# Patient Record
Sex: Male | Born: 1955 | Race: White | Hispanic: No | Marital: Married | Smoking: Never smoker
Health system: Southern US, Community
[De-identification: ages and names within clinical notes are randomized; demographics above are authoritative.]

## PROBLEM LIST (undated history)

## (undated) DIAGNOSIS — M1990 Unspecified osteoarthritis, unspecified site: Secondary | ICD-10-CM

## (undated) DIAGNOSIS — E785 Hyperlipidemia, unspecified: Secondary | ICD-10-CM

## (undated) DIAGNOSIS — T7840XA Allergy, unspecified, initial encounter: Secondary | ICD-10-CM

## (undated) HISTORY — PX: CERVICAL DISC SURGERY: SHX588

## (undated) HISTORY — DX: Hyperlipidemia, unspecified: E78.5

## (undated) HISTORY — PX: LUMBAR DISC SURGERY: SHX700

## (undated) HISTORY — DX: Unspecified osteoarthritis, unspecified site: M19.90

## (undated) HISTORY — DX: Allergy, unspecified, initial encounter: T78.40XA

## (undated) HISTORY — PX: SHOULDER SURGERY: SHX246

## (undated) HISTORY — PX: TONSILLECTOMY: SUR1361

## (undated) HISTORY — PX: COLONOSCOPY: SHX174

## (undated) HISTORY — PX: WISDOM TOOTH EXTRACTION: SHX21

---

## 2002-12-23 ENCOUNTER — Observation Stay (HOSPITAL_COMMUNITY): Admission: AD | Admit: 2002-12-23 | Discharge: 2002-12-24 | Payer: Self-pay | Admitting: Internal Medicine

## 2006-12-25 ENCOUNTER — Encounter: Admission: RE | Admit: 2006-12-25 | Discharge: 2006-12-25 | Payer: Self-pay | Admitting: Internal Medicine

## 2007-03-23 ENCOUNTER — Ambulatory Visit (HOSPITAL_COMMUNITY): Admission: RE | Admit: 2007-03-23 | Discharge: 2007-03-23 | Payer: Self-pay | Admitting: Neurosurgery

## 2007-07-14 ENCOUNTER — Encounter: Admission: RE | Admit: 2007-07-14 | Discharge: 2007-07-14 | Payer: Self-pay | Admitting: Neurosurgery

## 2008-07-08 ENCOUNTER — Ambulatory Visit: Payer: Self-pay | Admitting: Gastroenterology

## 2010-07-31 NOTE — Op Note (Signed)
Oscar Wilson, Oscar Wilson               ACCOUNT NO.:  [id number]   MEDICAL RECORD NO.:  [id number]          PATIENT TYPE:  AMB   LOCATION:  SDS                          FACILITY:  [facility]   PHYSICIAN:  Henry A. Pool, M.D.    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   PROCEDURE NOTE:  Left C6-7 laminotomy, foraminotomy and microdiskectomy.   SURGEON:  Kathaleen Maser. Pool, M.D.   ASSISTANT:  Donalee Citrin, M.D.   ANESTHESIA:  General endotracheal anesthesia.   PREMEDICATION:  Oscar Wilson' is a 55 year old male with history of neck  pain with left upper extremity pain, paresthesias and weakness  consistent with a left-sided C7 radiculopathy.  Workup demonstrates  evidence of focal left-sided C6-7 disk herniation with marked  compression of left-sided C7 nerve root.  We discussed options of  management including operative and nonoperative treatment.  We discussed  in detail the possibility of undergoing a left-sided C6-7 laminotomy,  foraminotomy and microdiskectomy in hopes of improving his symptoms.  The patient aware of the risks and benefits and wishes to proceed with  surgery.   DESCRIPTION OF PROCEDURE:  The patient taken to the operating room,  placed on the table in supine position.  After adequate level of  anesthesia was achieved, the patient positioned prone onto bolsters with  his head fixed in a neutral head position using a [place] pin headrest.  The patient's posterior cervical region is prepped and draped sterilely.  A 10 blade was used to make a linear skin incision overlying the C6-7  interspace.  This was carried down sharply in the midline.  A  subperiosteal dissection was then performed exposing the lamina of facet  joints at C6 and C7 on the left side.  Deep self-retaining retractor was  placed.  Intraoperative fluoroscopy was used.  The C6-7 level was  confirmed.  Laminotomy was then performed using high-speed drill and  Kerrison rongeurs __________  the inferior aspect lamina of C6, medial  aspect of the C6-7 facet joint and the superior aspect of the C7 lamina.  Ligamentum flavum was then elevated and resected in piecemeal fashion  using Kerrison rongeurs.  Microscope was then brought into the field and  used for microdissection of the left-sided T7 nerve root underlying disk  herniation.  Working in the axilla of the C7 nerve root, The C7 nerve  root was gently mobilized and retracted cephalad.  The disk herniation  was readily apparent.  This was then incised with an 11 blade.  Disk  herniation was then expressed under pressure and removed using pituitary  micro rongeurs.  At this point, all elements of the disk herniation were  completely resected.  All free elements of disk herniation were  completely removed as well.  At this point, there is no evidence of any  continuing compression upon the thecal sac or nerve  roots.  There is no  injury to the thecal sac or nerve roots.  Wound is then irrigated with  antibiotic solution.  Gelfoam was placed topically for hemostasis, which  was found to be good.  Microscope and retractor system were removed.  Hemostasis in the muscle achieved with  electrocautery.  Wound was closed in layers with Vicryl suture.  Steri-  Strips and sterile dressing were applied.  There were no apparent  complications.  The patient tolerated the procedure well and he returns  to the recovery room postoperatively.           ______________________________  Kathaleen Maser Pool, M.D.     HAP/MEDQ  D:  03/23/2007  T:  03/23/2007  Job:  [id number]

## 2010-12-21 LAB — DIFFERENTIAL
Basophils Absolute: 0
Basophils Relative: 0
Eosinophils Absolute: 0.2
Eosinophils Relative: 4
Lymphocytes Relative: 33
Lymphs Abs: 1.8
Monocytes Absolute: 0.4
Monocytes Relative: 8
Neutro Abs: 3
Neutrophils Relative %: 55

## 2010-12-21 LAB — BASIC METABOLIC PANEL WITH GFR
BUN: 14
CO2: 28
Calcium: 9.7
Chloride: 104
Creatinine, Ser: 0.93
GFR calc non Af Amer: >60
Glucose, Bld: 102 — ABNORMAL HIGH
Potassium: 4.2
Sodium: 138

## 2010-12-21 LAB — TYPE AND SCREEN
ABO/RH(D): O POS
Antibody Screen: NEGATIVE

## 2010-12-21 LAB — CBC
Hemoglobin: 16
MCHC: 34.8
MCV: 89.9

## 2010-12-21 LAB — ABO/RH: ABO/RH(D): O POS

## 2010-12-21 LAB — PROTIME-INR
INR: 0.9
Prothrombin Time: 12.6

## 2013-07-30 ENCOUNTER — Other Ambulatory Visit: Payer: Self-pay | Admitting: Orthopedic Surgery

## 2013-07-30 DIAGNOSIS — G8929 Other chronic pain: Secondary | ICD-10-CM

## 2013-07-30 DIAGNOSIS — M549 Dorsalgia, unspecified: Principal | ICD-10-CM

## 2013-08-13 ENCOUNTER — Ambulatory Visit
Admission: RE | Admit: 2013-08-13 | Discharge: 2013-08-13 | Disposition: A | Discharge status: Home / Self Care | Payer: Managed Care, Other (non HMO) | Source: Ambulatory Visit | Attending: Orthopedic Surgery | Admitting: Orthopedic Surgery

## 2013-08-13 ENCOUNTER — Other Ambulatory Visit: Payer: Self-pay | Admitting: Orthopedic Surgery

## 2013-08-13 ENCOUNTER — Ambulatory Visit
Admission: RE | Admit: 2013-08-13 | Discharge: 2013-08-13 | Disposition: A | Discharge status: Home / Self Care | Payer: Self-pay | Source: Ambulatory Visit | Attending: Orthopedic Surgery | Admitting: Orthopedic Surgery

## 2013-08-13 VITALS — BP 113/72 | HR 42

## 2013-08-13 DIAGNOSIS — G8929 Other chronic pain: Secondary | ICD-10-CM

## 2013-08-13 DIAGNOSIS — M4807 Spinal stenosis, lumbosacral region: Secondary | ICD-10-CM

## 2013-08-13 DIAGNOSIS — M549 Dorsalgia, unspecified: Secondary | ICD-10-CM

## 2013-08-13 MED ORDER — IOHEXOL 180 MG/ML  SOLN
15.0000 mL | Freq: Once | INTRAMUSCULAR | Status: AC | PRN
  Administered 2013-08-13: 15 mL via INTRATHECAL

## 2013-08-13 MED ORDER — DIAZEPAM 5 MG PO TABS
10.0000 mg | ORAL_TABLET | Freq: Once | ORAL | Status: AC
  Administered 2013-08-13: 10 mg via ORAL

## 2013-08-13 NOTE — Discharge Instructions (Signed)

## 2015-08-12 IMAGING — RF DG MYELOGRAPHY LUMBAR INJ LUMBOSACRAL
6 of 17 series · 6 of 17 positions shown · non-contrast
Comparison: MRI lumbar spine 01/14/2011

CLINICAL DATA: Low back pain extending into the right hip and lower
extremity. The pain exacerbated with walking and standing. There is
some relief with flexion.
TECHNIQUE: Contiguous axial images were obtained through the Lumbar spine after
the intrathecal infusion of infusion. Coronal and sagittal
reconstructions were obtained of the axial image sets.

[Series 1: (hospital) · 1 of 1 slices shown (1 of 2)]
[im 1/1]
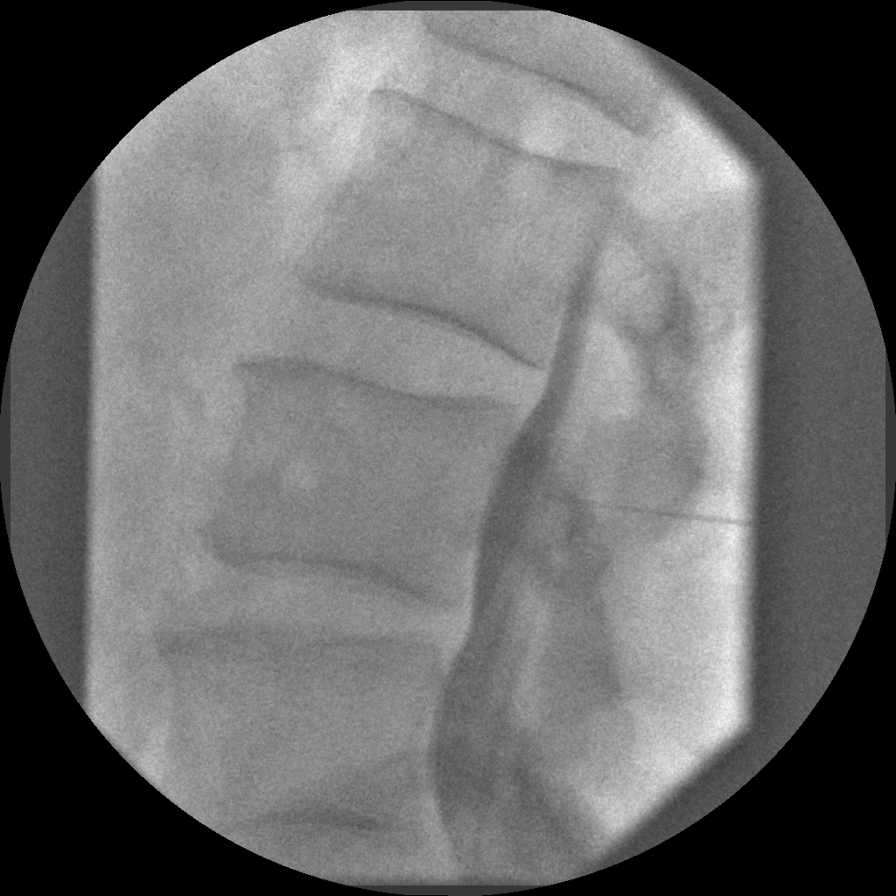

[Series 2: (hospital) · 1 of 1 slices shown (2 of 2)]
[im 1/1]
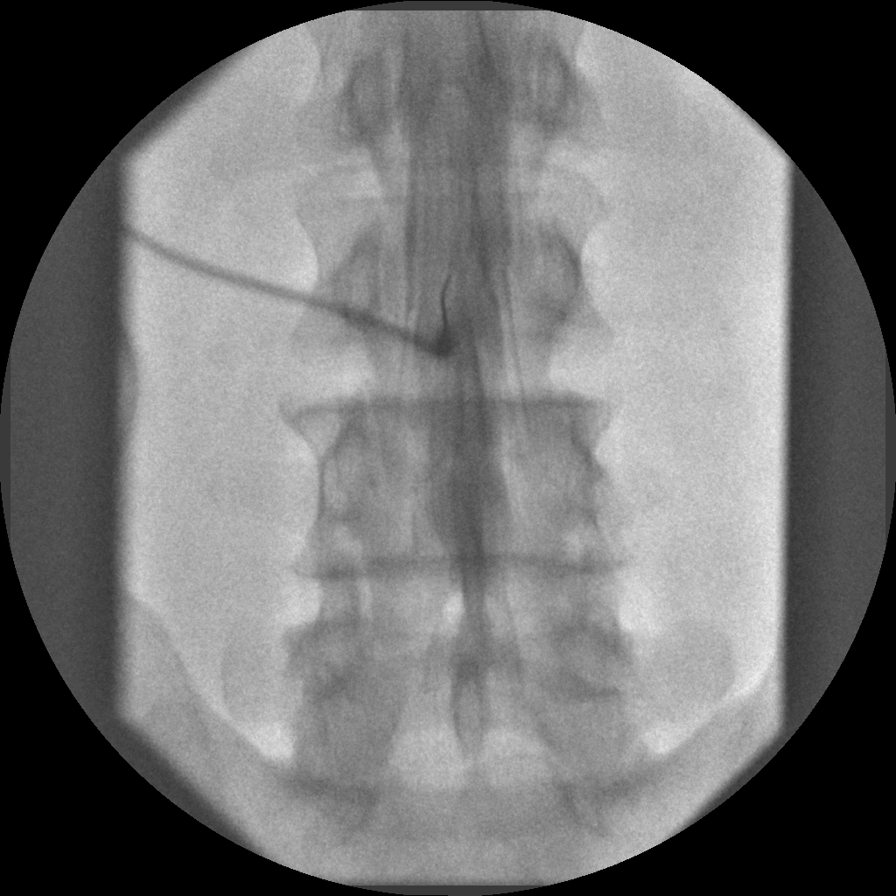

[Series 3: myelogram  white · 1 of 1 slices shown (1 of 4)]
[im 1/1]
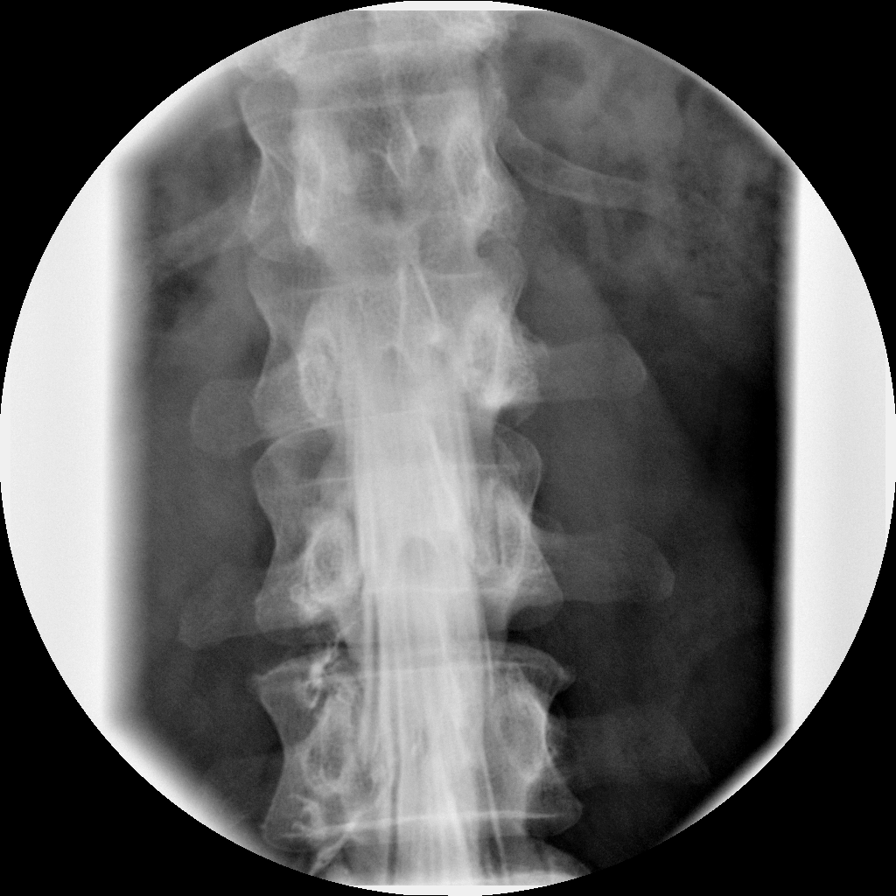

[Series 4: myelogram  white · 1 of 1 slices shown (2 of 4)]
[im 1/1]
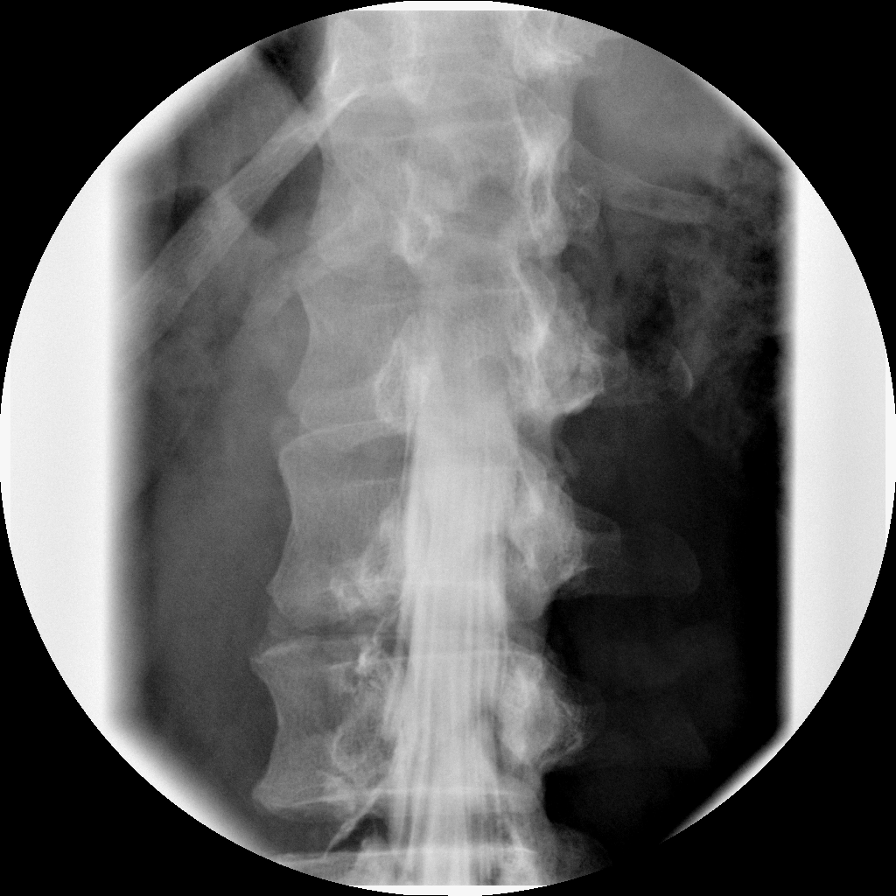

[Series 5: myelogram  white · 1 of 1 slices shown (3 of 4)]
[im 1/1]
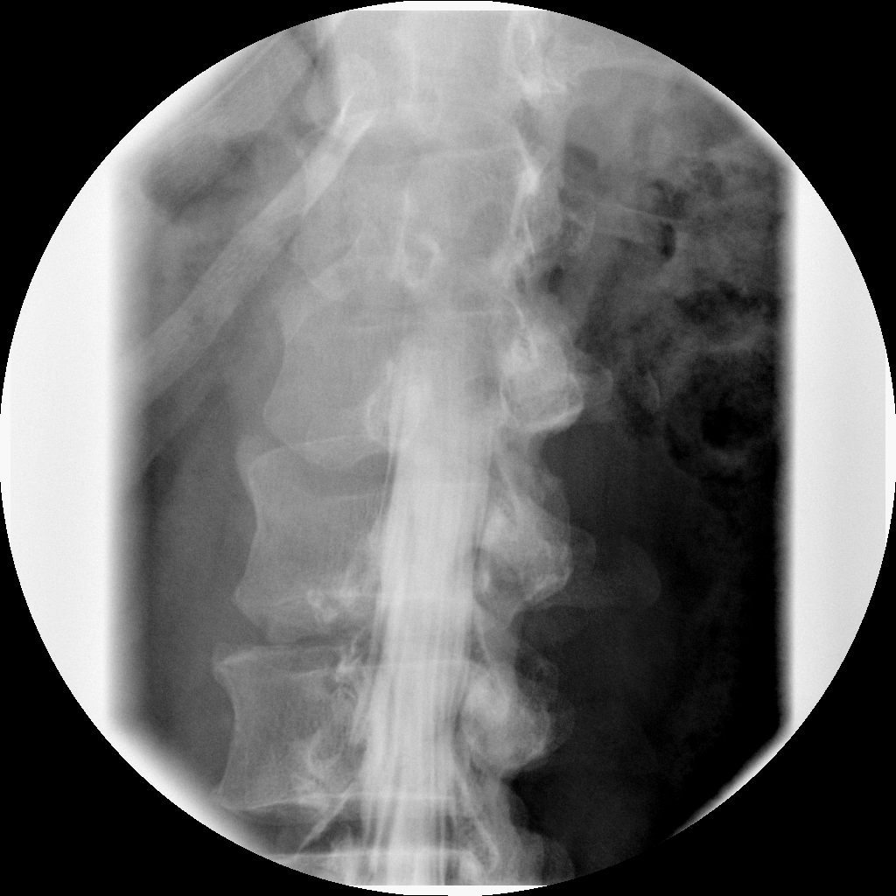

[Series 6: myelogram  white · 1 of 1 slices shown (4 of 4)]
[im 1/1]
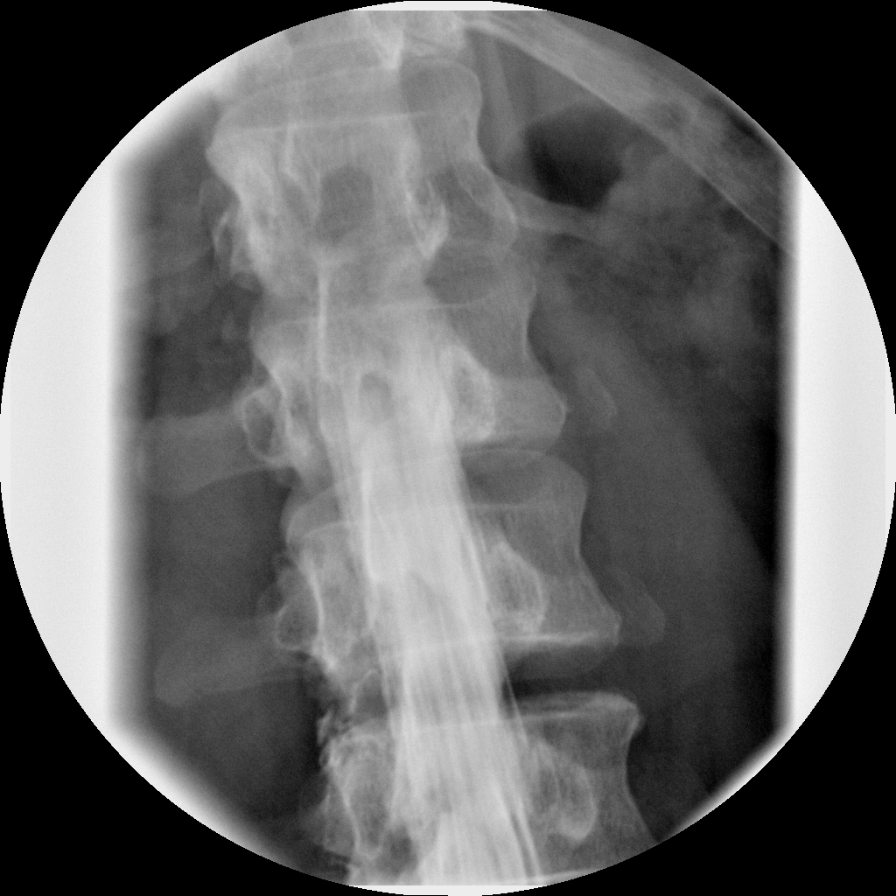

[6 of 17 positions shown; findings below may reference images not displayed]

EXAM:
LUMBAR MYELOGRAM

FLUOROSCOPY TIME:  65 seconds

PROCEDURE:
After thorough discussion of risks and benefits of the procedure
including bleeding, infection, injury to nerves, blood vessels,
adjacent structures as well as headache and CSF leak, written and
oral informed consent was obtained. Consent was obtained by Dr.
Bambucafe Tarla. Time out form was completed.

Patient was positioned prone on the fluoroscopy table. Local
anesthesia was provided with 1% lidocaine without epinephrine after
prepped and draped in the usual sterile fashion. Puncture was
performed at L2-3 using a 3 1/2 inch 22-gauge spinal needle via left
paramedian approach. Using a single pass through the dura, the
needle was placed within the thecal sac, with return of clear CSF.
15 mL of 6mnipaque-T5C was injected into the thecal sac, with normal
opacification of the nerve roots and cauda equina consistent with
free flow within the subarachnoid space.

I personally performed the lumbar puncture and administered the
intrathecal contrast. I also personally supervised acquisition of
the myelogram images.
FINDINGS: LUMBAR MYELOGRAM FINDINGS:

Grade 1 retrolisthesis at L3-4 is slightly more pronounced than on
the prior exam. Alignment is otherwise anatomic on the prone images.
Vertebral body heights are preserved. The nerve roots fill normally
at the L3-4 level.

Moderate central canal stenosis is present at L4-5 with lateral
recess narrowing bilaterally.

The disc protrusion at L4-5 is slightly more prominent upon
standing. There is slight increase in disk bulging at L3-4. Grade 1
anterolisthesis is apparent with flexion at L4-5. Alignment at L3-4
is unchanged with standing. There is no significant listhesis with
flexion or extension.

CT LUMBAR MYELOGRAM FINDINGS:

The lumbar spine is imaged from the midbody of T12 through S3.
Slight retrolisthesis at L3-4 and slight anterolisthesis at L4-5 are
similar to the prior MRI. The conus medullaris terminates at T12,
within normal limits. Vertebral body heights and alignment are
otherwise normal.

The disc levels at L2-3 and above are normal.

L3-4: Mild disc bulging is present without significant stenosis.
Minimal facet hypertrophy is evident.

L4-5: This is the most significant level. A broad-based disc
protrusion is present. Facet hypertrophy and ligamentum flavum
thickening has progressed. This leads to moderate central canal
stenosis with lateral recess narrowing bilaterally. Mild foraminal
narrowing is evident bilaterally as well. Facet hypertrophy
contributes.

L5-S1: Prominent epidural fat is evident without significant disc
protrusion or stenosis.
IMPRESSION: LUMBAR MYELOGRAM IMPRESSION:

1. Dynamic anterolisthesis at L4-5 with a broad-based disc
protrusion and moderate central canal stenosis.
2. Grade 1 retrolisthesis at L3-4 is slightly worse than on the
prior exam.

CT LUMBAR MYELOGRAM IMPRESSION:

1. Minimal retrolisthesis at L3-4 with mild disk bulging but no
significant stenosis.
2. Progressive disc protrusion, facet hypertrophy, and ligamentum
flavum thickening at L4-5 with moderate central canal stenosis and
lateral recess narrowing bilaterally.
3. Mild foraminal narrowing bilaterally at L4-5.
4. Epidural lipomatosis at L5-S1 without a significant disc
protrusion or stenosis.

## 2016-06-04 ENCOUNTER — Ambulatory Visit: Payer: Managed Care, Other (non HMO) | Admitting: Sports Medicine

## 2018-10-09 ENCOUNTER — Other Ambulatory Visit: Payer: Self-pay

## 2018-10-09 DIAGNOSIS — Z20822 Contact with and (suspected) exposure to covid-19: Secondary | ICD-10-CM

## 2018-10-11 LAB — NOVEL CORONAVIRUS, NAA: SARS-CoV-2, NAA: NOT DETECTED

## 2018-10-13 ENCOUNTER — Other Ambulatory Visit: Payer: Self-pay

## 2018-10-13 DIAGNOSIS — Z20822 Contact with and (suspected) exposure to covid-19: Secondary | ICD-10-CM

## 2018-10-15 LAB — NOVEL CORONAVIRUS, NAA: SARS-CoV-2, NAA: NOT DETECTED

## 2018-12-30 ENCOUNTER — Other Ambulatory Visit: Payer: Self-pay

## 2018-12-30 DIAGNOSIS — Z20822 Contact with and (suspected) exposure to covid-19: Secondary | ICD-10-CM

## 2018-12-31 LAB — NOVEL CORONAVIRUS, NAA: SARS-CoV-2, NAA: NOT DETECTED

## 2019-01-27 ENCOUNTER — Other Ambulatory Visit: Payer: Self-pay

## 2019-01-27 DIAGNOSIS — Z20822 Contact with and (suspected) exposure to covid-19: Secondary | ICD-10-CM

## 2019-01-29 LAB — NOVEL CORONAVIRUS, NAA: SARS-CoV-2, NAA: NOT DETECTED

## 2019-03-08 ENCOUNTER — Ambulatory Visit: Payer: Managed Care, Other (non HMO) | Attending: Internal Medicine

## 2019-03-08 DIAGNOSIS — R238 Other skin changes: Secondary | ICD-10-CM

## 2019-03-08 DIAGNOSIS — U071 COVID-19: Secondary | ICD-10-CM

## 2019-03-09 LAB — NOVEL CORONAVIRUS, NAA: SARS-CoV-2, NAA: NOT DETECTED

## 2019-04-13 ENCOUNTER — Other Ambulatory Visit: Payer: Managed Care, Other (non HMO)

## 2019-04-27 DIAGNOSIS — G8322 Monoplegia of upper limb affecting left dominant side: Secondary | ICD-10-CM | POA: Diagnosis not present

## 2019-04-27 DIAGNOSIS — R200 Anesthesia of skin: Secondary | ICD-10-CM | POA: Diagnosis not present

## 2019-05-01 DIAGNOSIS — G5602 Carpal tunnel syndrome, left upper limb: Secondary | ICD-10-CM | POA: Diagnosis not present

## 2019-05-04 DIAGNOSIS — M25522 Pain in left elbow: Secondary | ICD-10-CM | POA: Diagnosis not present

## 2019-05-04 DIAGNOSIS — G5602 Carpal tunnel syndrome, left upper limb: Secondary | ICD-10-CM | POA: Diagnosis not present

## 2019-05-10 DIAGNOSIS — M25522 Pain in left elbow: Secondary | ICD-10-CM | POA: Diagnosis not present

## 2019-05-14 DIAGNOSIS — M25522 Pain in left elbow: Secondary | ICD-10-CM | POA: Diagnosis not present

## 2019-05-14 DIAGNOSIS — G8322 Monoplegia of upper limb affecting left dominant side: Secondary | ICD-10-CM | POA: Diagnosis not present

## 2019-05-21 DIAGNOSIS — Z23 Encounter for immunization: Secondary | ICD-10-CM | POA: Diagnosis not present

## 2019-06-18 DIAGNOSIS — Z23 Encounter for immunization: Secondary | ICD-10-CM | POA: Diagnosis not present

## 2019-08-11 DIAGNOSIS — R739 Hyperglycemia, unspecified: Secondary | ICD-10-CM | POA: Diagnosis not present

## 2019-08-11 DIAGNOSIS — Z125 Encounter for screening for malignant neoplasm of prostate: Secondary | ICD-10-CM | POA: Diagnosis not present

## 2019-08-11 DIAGNOSIS — E7849 Other hyperlipidemia: Secondary | ICD-10-CM | POA: Diagnosis not present

## 2019-08-11 DIAGNOSIS — Z0000 Encounter for general adult medical examination without abnormal findings: Secondary | ICD-10-CM | POA: Diagnosis not present

## 2019-08-11 DIAGNOSIS — E559 Vitamin D deficiency, unspecified: Secondary | ICD-10-CM | POA: Diagnosis not present

## 2019-08-19 DIAGNOSIS — E663 Overweight: Secondary | ICD-10-CM | POA: Diagnosis not present

## 2019-08-19 DIAGNOSIS — R739 Hyperglycemia, unspecified: Secondary | ICD-10-CM | POA: Diagnosis not present

## 2019-08-19 DIAGNOSIS — Z1212 Encounter for screening for malignant neoplasm of rectum: Secondary | ICD-10-CM | POA: Diagnosis not present

## 2019-08-19 DIAGNOSIS — Z0000 Encounter for general adult medical examination without abnormal findings: Secondary | ICD-10-CM | POA: Diagnosis not present

## 2019-08-19 DIAGNOSIS — R399 Unspecified symptoms and signs involving the genitourinary system: Secondary | ICD-10-CM | POA: Diagnosis not present

## 2019-08-19 DIAGNOSIS — F418 Other specified anxiety disorders: Secondary | ICD-10-CM | POA: Diagnosis not present

## 2019-08-19 DIAGNOSIS — Z1389 Encounter for screening for other disorder: Secondary | ICD-10-CM | POA: Diagnosis not present

## 2019-11-04 ENCOUNTER — Encounter: Payer: Self-pay | Admitting: Gastroenterology

## 2019-12-28 ENCOUNTER — Ambulatory Visit (AMBULATORY_SURGERY_CENTER): Payer: Self-pay | Admitting: *Deleted

## 2019-12-28 ENCOUNTER — Other Ambulatory Visit: Payer: Self-pay

## 2019-12-28 VITALS — Ht 69.0 in | Wt 196.3 lb

## 2019-12-28 DIAGNOSIS — Z1211 Encounter for screening for malignant neoplasm of colon: Secondary | ICD-10-CM

## 2019-12-28 MED ORDER — PLENVU 140 G PO SOLR
1.0000 | ORAL | 0 refills | Status: DC
Start: 2019-12-28 — End: 2020-01-26

## 2019-12-28 NOTE — Progress Notes (Signed)
Cov vax x 2   No egg or soy allergy known to patient  No issues with past sedation with any surgeries or procedures no intubation problems in the past  No FH of Malignant Hyperthermia No diet pills per patient No home 02 use per patient  No blood thinners per patient  Pt denies issues with constipation  No A fib or A flutter  EMMI video to pt or via [place] 19 guidelines implemented in [place] today with Pt and RN   Plenvu  Coupon given to pt in [facility] today , Code to Pharmacy  Pt given the option in [facility] today for Golytely prep verses  alternative prep  ( Suprep/Plenvu)-  Pt is aware the Golytely has more volume but is more cost effective and the Suprep/Plenvu is less volume but may cost $50-150.  Pt voiced understanding of this and choose Plenvu  Prep.   Due to the COVID-19 pandemic we are asking patients to follow these guidelines. Please only bring one care partner. Please be aware that your care partner may wait in the car in the parking lot or if they feel like they will be too hot to wait in the car, they may wait in the lobby on the 4th floor. All care partners are required to wear a mask the entire time (we do not have any that we can provide them), they need to practice social distancing, and we will do a Covid check for all patient's and care partners when you arrive. Also we will check their temperature and your temperature. If the care partner waits in their car they need to stay in the parking lot the entire time and we will call them on their cell phone when the patient is ready for discharge so they can bring the car to the front of the building. Also all patient's will need to wear a mask into building.

## 2020-01-11 ENCOUNTER — Encounter: Payer: Managed Care, Other (non HMO) | Admitting: Gastroenterology

## 2020-01-13 ENCOUNTER — Encounter: Payer: Self-pay | Admitting: Gastroenterology

## 2020-01-26 ENCOUNTER — Encounter: Payer: Self-pay | Admitting: Gastroenterology

## 2020-01-26 ENCOUNTER — Ambulatory Visit (AMBULATORY_SURGERY_CENTER): Payer: BC Managed Care – PPO | Admitting: Gastroenterology

## 2020-01-26 ENCOUNTER — Other Ambulatory Visit: Payer: Self-pay

## 2020-01-26 VITALS — BP 92/65 | HR 41 | Temp 98.0°F | Resp 13 | Ht 69.0 in | Wt 196.0 lb

## 2020-01-26 DIAGNOSIS — Z1211 Encounter for screening for malignant neoplasm of colon: Secondary | ICD-10-CM | POA: Diagnosis not present

## 2020-01-26 DIAGNOSIS — D123 Benign neoplasm of transverse colon: Secondary | ICD-10-CM

## 2020-01-26 MED ORDER — SODIUM CHLORIDE 0.9 % IV SOLN: 500.0000 mL | Freq: Once | INTRAVENOUS | Status: AC

## 2020-01-26 NOTE — Patient Instructions (Signed)
1 polyp removed and sent to pathology.    Resume previous medications.  Await pathology for final recommendations.  Handouts on findings given to patient.    YOU HAD AN ENDOSCOPIC PROCEDURE TODAY AT [facility]:   Refer to the procedure report that was given to you for any specific questions about what was found during the examination.  If the procedure report does not answer your questions, please call your gastroenterologist to clarify.  If you requested that your care partner not be given the details of your procedure findings, then the procedure report has been included in a sealed envelope for you to review at your convenience later.  YOU SHOULD EXPECT: Some feelings of bloating in the abdomen. Passage of more gas than usual.  Walking can help get rid of the air that was put into your GI tract during the procedure and reduce the bloating. If you had a lower endoscopy (such as a colonoscopy or flexible sigmoidoscopy) you may notice spotting of blood in your stool or on the toilet paper. If you underwent a bowel prep for your procedure, you may not have a normal bowel movement for a few days.  Please Note:  You might notice some irritation and congestion in your nose or some drainage.  This is from the oxygen used during your procedure.  There is no need for concern and it should clear up in a day or so.  SYMPTOMS TO REPORT IMMEDIATELY:   Following lower endoscopy (colonoscopy or flexible sigmoidoscopy):  Excessive amounts of blood in the stool  Significant tenderness or worsening of abdominal pains  Swelling of the abdomen that is new, acute  Fever of 100F or higher   For urgent or emergent issues, a gastroenterologist can be reached at any hour by calling [phone]. Do not use MyChart messaging for urgent concerns.    DIET:  We do recommend a small meal at first, but then you may proceed to your regular diet.  Drink plenty of fluids but you should avoid alcoholic  beverages for 24 hours.  ACTIVITY:  You should plan to take it easy for the rest of today and you should NOT DRIVE or use heavy machinery until tomorrow (because of the sedation medicines used during the test).    FOLLOW UP: Our staff will call the number listed on your records 48-72 hours following your procedure to check on you and address any questions or concerns that you may have regarding the information given to you following your procedure. If we do not reach you, we will leave a message.  We will attempt to reach you two times.  During this call, we will ask if you have developed any symptoms of COVID 19. If you develop any symptoms (ie: fever, flu-like symptoms, shortness of breath, cough etc.) before then, please call [phone].  If you test positive for Covid 19 in the 2 weeks post procedure, please call and report this information to [place].    If any biopsies were taken you will be contacted by phone or by letter within the next 1-3 weeks.  Please call us at [phone] if you have not heard about the biopsies in 3 weeks.    SIGNATURES/CONFIDENTIALITY: You and/or your care partner have signed paperwork which will be entered into your electronic medical record.  These signatures attest to the fact that that the information above on your After Visit Summary has been reviewed and is understood.  Full responsibility of the  confidentiality of this discharge information lies with you and/or your care-partner. 

## 2020-01-26 NOTE — Progress Notes (Signed)
pt tolerated well. VSS. awake and to recovery. Report given to RN.  

## 2020-01-26 NOTE — Progress Notes (Signed)
Pt's states no medical or surgical changes since previsit or office visit. 

## 2020-01-26 NOTE — Op Note (Signed)
[place] Patient Name: Oscar Wilson Procedure Date: 01/26/2020 9:49 AM MRN: [id number] Endoscopist: Milus Banister , MD Age: 64 Referring MD:  Date of Birth: Jul 21, 1955 Gender: Male Account #: [id number] Procedure:                Colonoscopy Indications:              Screening for colorectal malignant neoplasm Medicines:                Monitored Anesthesia Care Procedure:                Pre-Anesthesia Assessment:                           - Prior to the procedure, a History and Physical                            was performed, and patient medications and                            allergies were reviewed. The patient's tolerance of                            previous anesthesia was also reviewed. The risks                            and benefits of the procedure and the sedation                            options and risks were discussed with the patient.                            All questions were answered, and informed consent                            was obtained. Prior Anticoagulants: The patient has                            taken no previous anticoagulant or antiplatelet                            agents. ASA Grade Assessment: II - A patient with                            mild systemic disease. After reviewing the risks                            and benefits, the patient was deemed in                            satisfactory condition to undergo the procedure.                           After obtaining informed consent, the colonoscope  was passed under direct vision. Throughout the                            procedure, the patient's blood pressure, pulse, and                            oxygen saturations were monitored continuously. The                            Colonoscope was introduced through the anus and                            advanced to the the cecum, identified by                            appendiceal orifice and  ileocecal valve. The                            colonoscopy was performed without difficulty. The                            patient tolerated the procedure well. The quality                            of the bowel preparation was good. The ileocecal                            valve, appendiceal orifice, and rectum were                            photographed. Scope In: 9:54:54 AM Scope Out: 10:08:41 AM Scope Withdrawal Time: 0 hours 10 minutes 13 seconds  Total Procedure Duration: 0 hours 13 minutes 47 seconds  Findings:                 A 3 mm polyp was found in the transverse colon. The                            polyp was sessile. The polyp was removed with a                            cold snare. Resection and retrieval were complete.                           The exam was otherwise without abnormality on                            direct and retroflexion views. Complications:            No immediate complications. Estimated blood loss:                            None. Estimated Blood Loss:     Estimated blood loss: none. Impression:               -  One 3 mm polyp in the transverse colon, removed                            with a cold snare. Resected and retrieved.                           - The examination was otherwise normal on direct                            and retroflexion views. Recommendation:           - Patient has a contact number available for                            emergencies. The signs and symptoms of potential                            delayed complications were discussed with the                            patient. Return to normal activities tomorrow.                            Written discharge instructions were provided to the                            patient.                           - Resume previous diet.                           - Continue present medications.                           - Await pathology results. Milus Banister, MD 01/26/2020  10:11:05 AM This report has been signed electronically.

## 2020-01-26 NOTE — Progress Notes (Signed)
Called to room to assist during endoscopic procedure.  Patient ID and intended procedure confirmed with present staff. Received instructions for my participation in the procedure from the performing physician.  

## 2020-01-28 ENCOUNTER — Telehealth: Payer: Self-pay | Admitting: *Deleted

## 2020-01-28 NOTE — Telephone Encounter (Signed)
  Follow up Call-  Call back number 01/26/2020  Post procedure Call Back phone  # [phone]  Permission to leave phone message Yes  Some recent data might be hidden     Patient questions:  Do you have a fever, pain , or abdominal swelling? No. Pain Score  0 *  Have you tolerated food without any problems? Yes.    Have you been able to return to your normal activities? Yes.    Do you have any questions about your discharge instructions: Diet   No. Medications  No. Follow up visit  No.  Do you have questions or concerns about your Care? No.  Actions: * If pain score is 4 or above: No action needed, pain <4  1. Have you developed a fever since your procedure? NO  2.   Have you had an respiratory symptoms (SOB or cough) since your procedure? NO  3.   Have you tested positive for COVID 19 since your procedure NO  4.   Have you had any family members/close contacts diagnosed with the COVID 19 since your procedure?  NO   If yes to any of these questions please route to Joylene John, RN and Joella Prince, RN

## 2020-02-01 ENCOUNTER — Encounter: Payer: Self-pay | Admitting: Gastroenterology

## 2020-02-01 DIAGNOSIS — D2371 Other benign neoplasm of skin of right lower limb, including hip: Secondary | ICD-10-CM | POA: Diagnosis not present

## 2020-02-01 DIAGNOSIS — L821 Other seborrheic keratosis: Secondary | ICD-10-CM | POA: Diagnosis not present

## 2020-02-01 DIAGNOSIS — D1801 Hemangioma of skin and subcutaneous tissue: Secondary | ICD-10-CM | POA: Diagnosis not present

## 2020-02-01 DIAGNOSIS — L918 Other hypertrophic disorders of the skin: Secondary | ICD-10-CM | POA: Diagnosis not present

## 2020-02-01 DIAGNOSIS — L570 Actinic keratosis: Secondary | ICD-10-CM | POA: Diagnosis not present

## 2020-03-13 ENCOUNTER — Other Ambulatory Visit: Payer: BC Managed Care – PPO

## 2020-03-13 DIAGNOSIS — Z20822 Contact with and (suspected) exposure to covid-19: Secondary | ICD-10-CM | POA: Diagnosis not present

## 2020-03-14 LAB — NOVEL CORONAVIRUS, NAA: SARS-CoV-2, NAA: NOT DETECTED

## 2020-03-14 LAB — SARS-COV-2, NAA 2 DAY TAT

## 2020-08-29 DIAGNOSIS — R82998 Other abnormal findings in urine: Secondary | ICD-10-CM | POA: Diagnosis not present

## 2021-06-23 ENCOUNTER — Other Ambulatory Visit: Payer: Self-pay

## 2021-06-23 ENCOUNTER — Encounter (HOSPITAL_COMMUNITY): Payer: Self-pay | Admitting: *Deleted

## 2021-06-23 ENCOUNTER — Ambulatory Visit (HOSPITAL_COMMUNITY)
Admission: EM | Admit: 2021-06-23 | Discharge: 2021-06-23 | Disposition: A | Discharge status: Home / Self Care | Payer: BC Managed Care – PPO | Attending: Internal Medicine | Admitting: Internal Medicine

## 2021-06-23 DIAGNOSIS — S61211A Laceration without foreign body of left index finger without damage to nail, initial encounter: Secondary | ICD-10-CM

## 2021-06-23 DIAGNOSIS — Z23 Encounter for immunization: Secondary | ICD-10-CM | POA: Diagnosis not present

## 2021-06-23 DIAGNOSIS — S61012A Laceration without foreign body of left thumb without damage to nail, initial encounter: Secondary | ICD-10-CM

## 2021-06-23 MED ORDER — LIDOCAINE HCL (PF) 1 % IJ SOLN
INTRAMUSCULAR | Status: AC
  Filled 2021-06-23: qty 4

## 2021-06-23 MED ORDER — TETANUS-DIPHTH-ACELL PERTUSSIS 5-2.5-18.5 LF-MCG/0.5 IM SUSY
PREFILLED_SYRINGE | INTRAMUSCULAR | Status: AC
  Filled 2021-06-23: qty 0.5

## 2021-06-23 MED ORDER — LIDOCAINE HCL (PF) 1 % IJ SOLN
INTRAMUSCULAR | Status: AC
  Filled 2021-06-23: qty 30

## 2021-06-23 MED ORDER — TETANUS-DIPHTH-ACELL PERTUSSIS 5-2.5-18.5 LF-MCG/0.5 IM SUSY
0.5000 mL | PREFILLED_SYRINGE | Freq: Once | INTRAMUSCULAR | Status: AC
  Administered 2021-06-23: 0.5 mL via INTRAMUSCULAR

## 2021-06-23 NOTE — ED Provider Notes (Signed)
?[place] ? ? ? ?CSN: [id number] ?Arrival date & time: 06/23/21  1233 ? ? ?  ? ?History   ?Chief Complaint ?Chief Complaint  ?Patient presents with  ? Laceration  ? ? ?HPI ?Oscar Wilson is a 66 y.o. male.  ? ?Patient presents with a laceration to left index finger and left thumb that occurred today approximately 2 hours prior to arrival.  Patient reports that he was trying to clean a meat cleaver when his hand slipped and he cut his fingers.  Denies any new numbness or tingling.  He does report that he has baseline numbness in the tip of his left index finger due to a cervical surgery in the past.  He also has limited range of motion with limited extension of the index finger due to the same surgery.  No new abnormalities in sensation or range of motion for patient since laceration occurred.  Patient also has full range of motion of thumb.  Patient does not take any blood thinning medications.  Patient is not sure of last tetanus vaccine. ? ? ?Laceration ? ?Past Medical History:  ?Diagnosis Date  ? Allergy   ? flower pollen   ? Arthritis   ? Hyperlipidemia   ? ? ?There are no problems to display for this patient. ? ? ?Past Surgical History:  ?Procedure Laterality Date  ? CERVICAL DISC SURGERY    ? C5C6  ? COLONOSCOPY    ? ~11-12 yrs ago- [place] [facility]- normal per pt   ? LUMBAR DISC SURGERY    ? L4L5  ? SHOULDER SURGERY Right   ? TONSILLECTOMY    ? WISDOM TOOTH EXTRACTION    ? ? ? ? ? ?Home Medications   ? ?Prior to Admission medications   ?Medication Sig Start Date End Date Taking? Authorizing Provider  ?cholecalciferol (VITAMIN D3) 25 MCG (1000 UNIT) tablet Take 1,000 Units by mouth daily. occ 2,000 mg    [provider]  ?simvastatin (ZOCOR) 20 MG tablet Take 20 mg by mouth at bedtime. 12/23/19   [provider]  ?zolpidem (AMBIEN) 5 MG tablet Take 5 mg by mouth at bedtime as needed for sleep. ?Patient not taking: Reported on 01/26/2020    [provider]  ? ? ?Family  History ?Family History  ?Problem Relation Age of Onset  ? Liver cancer Mother   ? Colon cancer Neg Hx   ? Colon polyps Neg Hx   ? Esophageal cancer Neg Hx   ? Rectal cancer Neg Hx   ? Stomach cancer Neg Hx   ? ? ?Social History ?Social History  ? ?Tobacco Use  ? Smoking status: Never  ? Smokeless tobacco: Never  ?Substance Use Topics  ? Alcohol use: Yes  ?  Comment: social/ occ   ? Drug use: Never  ? ? ? ?Allergies   ?Patient has no known allergies. ? ? ?Review of Systems ?Review of Systems ?Per HPI ? ?Physical Exam ?Triage Vital Signs ?ED Triage Vitals  ?Enc Vitals Group  ?   BP 06/23/21 1355 (!) 149/82  ?   Pulse Rate 06/23/21 1355 (!) 44  ?   Resp 06/23/21 1355 18  ?   Temp 06/23/21 1355 97.6 ?F (36.4 ?C)  ?   Temp src --   ?   SpO2 06/23/21 1355 95 %  ?   Weight --   ?   Height --   ?   Head Circumference --   ?   Peak Flow --   ?  Pain Score 06/23/21 1354 0  ?   Pain Loc --   ?   Pain Edu? --   ?   Excl. in [place]? --   ? ?No data found. ? ?Updated Vital Signs ?BP (!) 149/82   Pulse (!) 44 Comment: Pt reports his Pulse is alwasys runs low.  Temp 97.6 ?F (36.4 ?C)   Resp 18   SpO2 95%  ? ?Visual Acuity ?Right Eye Distance:   ?Left Eye Distance:   ?Bilateral Distance:   ? ?Right Eye Near:   ?Left Eye Near:    ?Bilateral Near:    ? ?Physical Exam ?Constitutional:   ?   General: He is not in acute distress. ?   Appearance: Normal appearance. He is not toxic-appearing or diaphoretic.  ?HENT:  ?   Head: Normocephalic and atraumatic.  ?Eyes:  ?   Extraocular Movements: Extraocular movements intact.  ?   Conjunctiva/sclera: Conjunctivae normal.  ?Pulmonary:  ?   Effort: Pulmonary effort is normal.  ?Skin: ?   Findings: Laceration present.  ?   Comments: Approximately 2 cm linear laceration overlying joint between middle and proximal phalanx.  Another approximately 2 cm linear laceration overlying joint of the first metacarpal.  Both lacerations are very superficial.  No tendons or bones noted upon exploration. Nails  are intact. Patient has full range of motion of first digit.  Limited extension of first digit but patient reports this is baseline.  Patient appears to be neurovascularly intact and at baseline.  Bleeding is controlled.  ?Neurological:  ?   General: No focal deficit present.  ?   Mental Status: He is alert and oriented to person, place, and time. Mental status is at baseline.  ?Psychiatric:     ?   Mood and Affect: Mood normal.     ?   Behavior: Behavior normal.     ?   Thought Content: Thought content normal.     ?   Judgment: Judgment normal.  ? ? ? ?UC Treatments / Results  ?Labs ?(all labs ordered are listed, but only abnormal results are displayed) ?Labs Reviewed - No data to display ? ?EKG ? ? ?Radiology ?No results found. ? ?Procedures ?Laceration Repair ? ?Date/Time: 06/23/2021 3:39 PM ?Performed by: Teodora Medici, FNP ?Authorized by: Teodora Medici, FNP  ? ?Consent:  ?  Consent obtained:  Verbal ?  Consent given by:  Patient ?  Risks, benefits, and alternatives were discussed: yes   ?  Risks discussed:  Infection and pain ?  Alternatives discussed:  No treatment and delayed treatment ?Universal protocol:  ?  Procedure explained and questions answered to patient or proxy's satisfaction: yes   ?  Site/side marked: yes   ?  Immediately prior to procedure, a time out was called: yes   ?  Patient identity confirmed:  Verbally with patient and arm band ?Anesthesia:  ?  Anesthesia method:  Local infiltration ?  Local anesthetic:  Lidocaine 1% w/o epi ?Laceration details:  ?  Location:  Finger ?  Finger location:  L index finger ?  Length (cm):  2 ?Pre-procedure details:  ?  Preparation:  Patient was prepped and draped in usual sterile fashion ?Exploration:  ?  Imaging outcome: foreign body not noted   ?  Wound exploration: wound explored through full range of motion and entire depth of wound visualized   ?  Contaminated: no   ?Treatment:  ?  Area cleansed with:  Chlorhexidine ?  Amount  of cleaning:  Standard ?Skin  repair:  ?  Repair method:  Sutures ?  Suture size:  4-0 and 5-0 ?  Suture material:  Nylon ?  Suture technique:  Simple interrupted ?  Number of sutures:  10 (5 on index finger and 5 on thumb) ?Approximation:  ?  Approximation:  Close ?Repair type:  ?  Repair type:  Simple ?Post-procedure details:  ?  Dressing:  Antibiotic ointment and non-adherent dressing ?  Procedure completion:  Tolerated well, no immediate complications (including critical care time) ? ?Medications Ordered in UC ?Medications  ?Tdap (BOOSTRIX) injection 0.5 mL (0.5 mLs Intramuscular Given 06/23/21 1516)  ? ? ?Initial Impression / Assessment and Plan / UC Course  ?I have reviewed the triage vital signs and the nursing notes. ? ?Pertinent labs & imaging results that were available during my care of the patient were reviewed by me and considered in my medical decision making (see chart for details). ? ?  ? ?Lacerations were repaired with close approximation.  Tetanus vaccine updated today.  Patient advised to monitor for signs of infection and return if these occur.  Patient to follow-up in 10 days for suture removal.  Patient has a low heart rate but this appears to be baseline for patient.  Discussed return precautions.  Patient verbalized understanding and was agreeable with plan. ?Final Clinical Impressions(s) / UC Diagnoses  ? ?Final diagnoses:  ?Laceration of left thumb without foreign body without damage to nail, initial encounter  ?Laceration of left index finger without foreign body without damage to nail, initial encounter  ? ? ? ?Discharge Instructions   ? ?  ?Your lacerations have been repaired.  Please follow-up with signs of infection that include increased redness, swelling, pus occur.  Follow-up in 10 days to have sutures removed. ? ? ? ?ED Prescriptions   ?None ?  ? ?PDMP not reviewed this encounter. ?  ?Teodora Medici, [place] ?06/23/21 1541 ? ?

## 2021-06-23 NOTE — ED Triage Notes (Signed)
Pt reports lac to LT index finger and Lt thumb. Pt reports he was cleaning a cleaver and the brush slipped and lac to digits today. ?

## 2021-06-23 NOTE — Discharge Instructions (Signed)
Your lacerations have been repaired.  Please follow-up with signs of infection that include increased redness, swelling, pus occur.  Follow-up in 10 days to have sutures removed. ?

## 2021-06-28 ENCOUNTER — Ambulatory Visit: Payer: BC Managed Care – PPO | Admitting: Podiatry

## 2021-09-06 DIAGNOSIS — Z125 Encounter for screening for malignant neoplasm of prostate: Secondary | ICD-10-CM | POA: Diagnosis not present

## 2021-09-06 DIAGNOSIS — E559 Vitamin D deficiency, unspecified: Secondary | ICD-10-CM | POA: Diagnosis not present

## 2021-09-06 DIAGNOSIS — E785 Hyperlipidemia, unspecified: Secondary | ICD-10-CM | POA: Diagnosis not present

## 2021-09-06 DIAGNOSIS — R739 Hyperglycemia, unspecified: Secondary | ICD-10-CM | POA: Diagnosis not present

## 2021-09-10 DIAGNOSIS — M25572 Pain in left ankle and joints of left foot: Secondary | ICD-10-CM | POA: Diagnosis not present

## 2021-09-12 DIAGNOSIS — Z0000 Encounter for general adult medical examination without abnormal findings: Secondary | ICD-10-CM | POA: Diagnosis not present

## 2021-09-13 DIAGNOSIS — Z1339 Encounter for screening examination for other mental health and behavioral disorders: Secondary | ICD-10-CM | POA: Diagnosis not present

## 2021-09-13 DIAGNOSIS — R82998 Other abnormal findings in urine: Secondary | ICD-10-CM | POA: Diagnosis not present

## 2021-09-13 DIAGNOSIS — Z0000 Encounter for general adult medical examination without abnormal findings: Secondary | ICD-10-CM | POA: Diagnosis not present

## 2021-09-13 DIAGNOSIS — E785 Hyperlipidemia, unspecified: Secondary | ICD-10-CM | POA: Diagnosis not present

## 2021-09-13 DIAGNOSIS — Z1331 Encounter for screening for depression: Secondary | ICD-10-CM | POA: Diagnosis not present

## 2021-09-26 DIAGNOSIS — M25562 Pain in left knee: Secondary | ICD-10-CM | POA: Diagnosis not present

## 2021-09-26 DIAGNOSIS — M25561 Pain in right knee: Secondary | ICD-10-CM | POA: Diagnosis not present

## 2022-02-11 DIAGNOSIS — D2271 Melanocytic nevi of right lower limb, including hip: Secondary | ICD-10-CM | POA: Diagnosis not present

## 2022-02-11 DIAGNOSIS — D2261 Melanocytic nevi of right upper limb, including shoulder: Secondary | ICD-10-CM | POA: Diagnosis not present

## 2022-02-11 DIAGNOSIS — D2262 Melanocytic nevi of left upper limb, including shoulder: Secondary | ICD-10-CM | POA: Diagnosis not present

## 2022-02-11 DIAGNOSIS — D225 Melanocytic nevi of trunk: Secondary | ICD-10-CM | POA: Diagnosis not present

## 2022-03-22 ENCOUNTER — Ambulatory Visit: Payer: BC Managed Care – PPO | Admitting: Podiatry

## 2022-03-22 ENCOUNTER — Other Ambulatory Visit: Payer: Self-pay | Admitting: Podiatry

## 2022-03-22 ENCOUNTER — Ambulatory Visit (INDEPENDENT_AMBULATORY_CARE_PROVIDER_SITE_OTHER): Payer: BC Managed Care – PPO

## 2022-03-22 VITALS — BP 159/90

## 2022-03-22 DIAGNOSIS — M722 Plantar fascial fibromatosis: Secondary | ICD-10-CM

## 2022-03-22 DIAGNOSIS — M216X9 Other acquired deformities of unspecified foot: Secondary | ICD-10-CM | POA: Diagnosis not present

## 2022-03-22 DIAGNOSIS — M79671 Pain in right foot: Secondary | ICD-10-CM

## 2022-03-22 DIAGNOSIS — M79672 Pain in left foot: Secondary | ICD-10-CM

## 2022-03-22 MED ORDER — DICLOFENAC SODIUM 75 MG PO TBEC: 75.0000 mg | DELAYED_RELEASE_TABLET | Freq: Two times a day (BID) | ORAL | 2 refills | Status: AC

## 2022-03-22 MED ORDER — TRIAMCINOLONE ACETONIDE 10 MG/ML IJ SUSP
10.0000 mg | Freq: Once | INTRAMUSCULAR | Status: AC
  Administered 2022-03-22: 10 mg

## 2022-03-22 NOTE — Patient Instructions (Signed)

## 2022-03-23 NOTE — Progress Notes (Signed)
Subjective:   Patient ID: Oscar Wilson, male   DOB: 67 y.o.   MRN: [id number]   HPI Patient states has had a several year history of pain in his left arch and also somewhat into the top and side of his foot.  States he tries to be very active likes to play pickle ball would like to run but has not been able to walk and states the pain slowly has gotten worse over time.  Patient does not smoke likes to be active   Review of Systems  All other systems reviewed and are negative.       Objective:  Physical Exam Vitals and nursing note reviewed.  Constitutional:      Appearance: He is well-developed.  Pulmonary:     Effort: Pulmonary effort is normal.  Musculoskeletal:        General: Normal range of motion.  Skin:    General: Skin is warm.  Neurological:     Mental Status: He is alert.     Neurovascular status found to be intact muscle strength was found to be adequate range of motion adequate.  Patient is noted to have inflammation mostly in the mid arch area left with fluid buildup and mild discomfort dorsally and lateral.  Patient has good digital perfusion well-oriented x 3 and does have moderate high cavus foot structure     Assessment:  Inflammatory fasciitis mid arch area left that is painful when pressed makes certain activities difficult with structural changes in the foot and ankle     Plan:  H&P explained this is a difficult type condition given the nature of his problem length of time has had it.  At this point I did go ahead I want to try to reduce the inflammation to try to start him on a good stretch routine and I did do a mid arch injection 3 mg Kenalog 5 mg Xylocaine and then dispensed a night splint and gave instructions on heat ice therapy.  Reappoint to recheck again in the next several weeks may require orthotics or other treatments depending on response  X-rays indicate no signs of spurring does have a relative high arch foot structure

## 2022-03-26 DIAGNOSIS — M67912 Unspecified disorder of synovium and tendon, left shoulder: Secondary | ICD-10-CM | POA: Diagnosis not present

## 2022-04-05 ENCOUNTER — Encounter: Payer: Self-pay | Admitting: Podiatry

## 2022-04-05 ENCOUNTER — Ambulatory Visit: Payer: BC Managed Care – PPO | Admitting: Podiatry

## 2022-04-05 DIAGNOSIS — M722 Plantar fascial fibromatosis: Secondary | ICD-10-CM

## 2022-04-08 NOTE — Progress Notes (Signed)
Subjective:   Patient ID: Oscar Wilson, male   DOB: 67 y.o.   MRN: [id number]   HPI Patient presents stating that he still getting quite a bit of pain when he gets up in the morning or after periods of sitting and was able to play pickle ball a little bit   ROS      Objective:  Physical Exam  Neurovascular status intact with discomfort still present but moderate improvement with pain mostly in the center and central lateral aspect of the tendon currently     Assessment:  Fasciitis symptomatology that still remains quite intense especially after periods of sitting and when getting up in the morning     Plan:  Reviewed condition and recommended continued night splint usage and we dispensed 1 today that was fitted properly to the lower leg and explained how to use.  Patient will be seen back to recheck again and may require other treatments in future depending on response to this form of immobilization

## 2022-04-25 ENCOUNTER — Ambulatory Visit: Payer: BC Managed Care – PPO | Admitting: Podiatry

## 2022-04-26 DIAGNOSIS — L509 Urticaria, unspecified: Secondary | ICD-10-CM | POA: Diagnosis not present

## 2022-04-26 DIAGNOSIS — L299 Pruritus, unspecified: Secondary | ICD-10-CM | POA: Diagnosis not present

## 2022-06-17 DIAGNOSIS — R0981 Nasal congestion: Secondary | ICD-10-CM | POA: Diagnosis not present

## 2022-06-17 DIAGNOSIS — R051 Acute cough: Secondary | ICD-10-CM | POA: Diagnosis not present

## 2022-06-17 DIAGNOSIS — J069 Acute upper respiratory infection, unspecified: Secondary | ICD-10-CM | POA: Diagnosis not present

## 2022-10-07 DIAGNOSIS — E559 Vitamin D deficiency, unspecified: Secondary | ICD-10-CM | POA: Diagnosis not present

## 2022-10-07 DIAGNOSIS — E785 Hyperlipidemia, unspecified: Secondary | ICD-10-CM | POA: Diagnosis not present

## 2022-10-07 DIAGNOSIS — R739 Hyperglycemia, unspecified: Secondary | ICD-10-CM | POA: Diagnosis not present

## 2022-10-07 DIAGNOSIS — R7989 Other specified abnormal findings of blood chemistry: Secondary | ICD-10-CM | POA: Diagnosis not present

## 2022-10-07 DIAGNOSIS — Z125 Encounter for screening for malignant neoplasm of prostate: Secondary | ICD-10-CM | POA: Diagnosis not present

## 2022-10-14 DIAGNOSIS — R739 Hyperglycemia, unspecified: Secondary | ICD-10-CM | POA: Diagnosis not present

## 2022-10-14 DIAGNOSIS — R399 Unspecified symptoms and signs involving the genitourinary system: Secondary | ICD-10-CM | POA: Diagnosis not present

## 2022-10-14 DIAGNOSIS — M1990 Unspecified osteoarthritis, unspecified site: Secondary | ICD-10-CM | POA: Diagnosis not present

## 2022-10-14 DIAGNOSIS — K5900 Constipation, unspecified: Secondary | ICD-10-CM | POA: Diagnosis not present

## 2022-10-14 DIAGNOSIS — E785 Hyperlipidemia, unspecified: Secondary | ICD-10-CM | POA: Diagnosis not present

## 2022-10-14 DIAGNOSIS — Z23 Encounter for immunization: Secondary | ICD-10-CM | POA: Diagnosis not present

## 2022-10-14 DIAGNOSIS — R82998 Other abnormal findings in urine: Secondary | ICD-10-CM | POA: Diagnosis not present

## 2022-10-14 DIAGNOSIS — Z0000 Encounter for general adult medical examination without abnormal findings: Secondary | ICD-10-CM | POA: Diagnosis not present

## 2022-10-14 DIAGNOSIS — F418 Other specified anxiety disorders: Secondary | ICD-10-CM | POA: Diagnosis not present

## 2022-10-14 DIAGNOSIS — G4700 Insomnia, unspecified: Secondary | ICD-10-CM | POA: Diagnosis not present

## 2022-10-14 DIAGNOSIS — E663 Overweight: Secondary | ICD-10-CM | POA: Diagnosis not present

## 2022-10-14 DIAGNOSIS — E559 Vitamin D deficiency, unspecified: Secondary | ICD-10-CM | POA: Diagnosis not present

## 2023-01-14 DIAGNOSIS — Q142 Congenital malformation of optic disc: Secondary | ICD-10-CM | POA: Diagnosis not present

## 2023-01-14 DIAGNOSIS — H5319 Other subjective visual disturbances: Secondary | ICD-10-CM | POA: Diagnosis not present

## 2023-01-14 DIAGNOSIS — H35411 Lattice degeneration of retina, right eye: Secondary | ICD-10-CM | POA: Diagnosis not present

## 2023-01-14 DIAGNOSIS — H33321 Round hole, right eye: Secondary | ICD-10-CM | POA: Diagnosis not present

## 2023-01-15 ENCOUNTER — Encounter (INDEPENDENT_AMBULATORY_CARE_PROVIDER_SITE_OTHER): Payer: PPO | Admitting: Ophthalmology

## 2023-01-15 DIAGNOSIS — H33301 Unspecified retinal break, right eye: Secondary | ICD-10-CM | POA: Diagnosis not present

## 2023-01-15 DIAGNOSIS — H43813 Vitreous degeneration, bilateral: Secondary | ICD-10-CM

## 2023-01-30 ENCOUNTER — Encounter (INDEPENDENT_AMBULATORY_CARE_PROVIDER_SITE_OTHER): Payer: PPO | Admitting: Ophthalmology

## 2023-01-30 DIAGNOSIS — D3132 Benign neoplasm of left choroid: Secondary | ICD-10-CM | POA: Diagnosis not present

## 2023-01-30 DIAGNOSIS — H43813 Vitreous degeneration, bilateral: Secondary | ICD-10-CM

## 2023-01-30 DIAGNOSIS — H33301 Unspecified retinal break, right eye: Secondary | ICD-10-CM | POA: Diagnosis not present

## 2023-01-30 DIAGNOSIS — H2513 Age-related nuclear cataract, bilateral: Secondary | ICD-10-CM | POA: Diagnosis not present

## 2023-02-11 ENCOUNTER — Encounter (INDEPENDENT_AMBULATORY_CARE_PROVIDER_SITE_OTHER): Payer: PPO | Admitting: Ophthalmology

## 2023-02-11 DIAGNOSIS — H4311 Vitreous hemorrhage, right eye: Secondary | ICD-10-CM

## 2023-02-11 DIAGNOSIS — H43813 Vitreous degeneration, bilateral: Secondary | ICD-10-CM | POA: Diagnosis not present

## 2023-02-11 DIAGNOSIS — H33301 Unspecified retinal break, right eye: Secondary | ICD-10-CM

## 2023-02-11 DIAGNOSIS — D3132 Benign neoplasm of left choroid: Secondary | ICD-10-CM | POA: Diagnosis not present

## 2023-02-11 DIAGNOSIS — H2513 Age-related nuclear cataract, bilateral: Secondary | ICD-10-CM | POA: Diagnosis not present

## 2023-02-25 ENCOUNTER — Encounter (INDEPENDENT_AMBULATORY_CARE_PROVIDER_SITE_OTHER): Payer: PPO | Admitting: Ophthalmology

## 2023-02-25 DIAGNOSIS — H2513 Age-related nuclear cataract, bilateral: Secondary | ICD-10-CM | POA: Diagnosis not present

## 2023-02-25 DIAGNOSIS — H43813 Vitreous degeneration, bilateral: Secondary | ICD-10-CM

## 2023-02-25 DIAGNOSIS — D3132 Benign neoplasm of left choroid: Secondary | ICD-10-CM

## 2023-02-25 DIAGNOSIS — H33301 Unspecified retinal break, right eye: Secondary | ICD-10-CM

## 2023-03-06 ENCOUNTER — Encounter (INDEPENDENT_AMBULATORY_CARE_PROVIDER_SITE_OTHER): Payer: PPO | Admitting: Ophthalmology

## 2023-03-06 DIAGNOSIS — H33301 Unspecified retinal break, right eye: Secondary | ICD-10-CM

## 2023-03-06 DIAGNOSIS — D3132 Benign neoplasm of left choroid: Secondary | ICD-10-CM

## 2023-03-06 DIAGNOSIS — H43813 Vitreous degeneration, bilateral: Secondary | ICD-10-CM

## 2023-03-06 DIAGNOSIS — H2513 Age-related nuclear cataract, bilateral: Secondary | ICD-10-CM

## 2023-03-06 DIAGNOSIS — H4311 Vitreous hemorrhage, right eye: Secondary | ICD-10-CM

## 2023-03-26 DIAGNOSIS — L570 Actinic keratosis: Secondary | ICD-10-CM | POA: Diagnosis not present

## 2023-03-26 DIAGNOSIS — L812 Freckles: Secondary | ICD-10-CM | POA: Diagnosis not present

## 2023-03-26 DIAGNOSIS — L821 Other seborrheic keratosis: Secondary | ICD-10-CM | POA: Diagnosis not present

## 2023-03-27 DIAGNOSIS — H04123 Dry eye syndrome of bilateral lacrimal glands: Secondary | ICD-10-CM | POA: Diagnosis not present

## 2023-05-01 DIAGNOSIS — H04123 Dry eye syndrome of bilateral lacrimal glands: Secondary | ICD-10-CM | POA: Diagnosis not present

## 2023-05-30 ENCOUNTER — Encounter (INDEPENDENT_AMBULATORY_CARE_PROVIDER_SITE_OTHER): Payer: PPO | Admitting: Ophthalmology

## 2023-05-30 DIAGNOSIS — H43813 Vitreous degeneration, bilateral: Secondary | ICD-10-CM | POA: Diagnosis not present

## 2023-05-30 DIAGNOSIS — D3132 Benign neoplasm of left choroid: Secondary | ICD-10-CM | POA: Diagnosis not present

## 2023-05-30 DIAGNOSIS — H2513 Age-related nuclear cataract, bilateral: Secondary | ICD-10-CM

## 2023-05-30 DIAGNOSIS — H33301 Unspecified retinal break, right eye: Secondary | ICD-10-CM

## 2023-10-10 DIAGNOSIS — E559 Vitamin D deficiency, unspecified: Secondary | ICD-10-CM | POA: Diagnosis not present

## 2023-10-10 DIAGNOSIS — E785 Hyperlipidemia, unspecified: Secondary | ICD-10-CM | POA: Diagnosis not present

## 2023-10-10 DIAGNOSIS — Z125 Encounter for screening for malignant neoplasm of prostate: Secondary | ICD-10-CM | POA: Diagnosis not present

## 2023-10-10 DIAGNOSIS — Z1212 Encounter for screening for malignant neoplasm of rectum: Secondary | ICD-10-CM | POA: Diagnosis not present

## 2023-10-10 DIAGNOSIS — E7849 Other hyperlipidemia: Secondary | ICD-10-CM | POA: Diagnosis not present

## 2023-10-10 DIAGNOSIS — R739 Hyperglycemia, unspecified: Secondary | ICD-10-CM | POA: Diagnosis not present

## 2023-10-17 DIAGNOSIS — M1990 Unspecified osteoarthritis, unspecified site: Secondary | ICD-10-CM | POA: Diagnosis not present

## 2023-10-17 DIAGNOSIS — K5900 Constipation, unspecified: Secondary | ICD-10-CM | POA: Diagnosis not present

## 2023-10-17 DIAGNOSIS — F418 Other specified anxiety disorders: Secondary | ICD-10-CM | POA: Diagnosis not present

## 2023-10-17 DIAGNOSIS — Z0000 Encounter for general adult medical examination without abnormal findings: Secondary | ICD-10-CM | POA: Diagnosis not present

## 2023-10-17 DIAGNOSIS — E663 Overweight: Secondary | ICD-10-CM | POA: Diagnosis not present

## 2023-10-17 DIAGNOSIS — E559 Vitamin D deficiency, unspecified: Secondary | ICD-10-CM | POA: Diagnosis not present

## 2023-10-17 DIAGNOSIS — G4700 Insomnia, unspecified: Secondary | ICD-10-CM | POA: Diagnosis not present

## 2023-10-17 DIAGNOSIS — R739 Hyperglycemia, unspecified: Secondary | ICD-10-CM | POA: Diagnosis not present

## 2023-10-17 DIAGNOSIS — R399 Unspecified symptoms and signs involving the genitourinary system: Secondary | ICD-10-CM | POA: Diagnosis not present

## 2023-10-17 DIAGNOSIS — E785 Hyperlipidemia, unspecified: Secondary | ICD-10-CM | POA: Diagnosis not present

## 2023-10-17 DIAGNOSIS — R82998 Other abnormal findings in urine: Secondary | ICD-10-CM | POA: Diagnosis not present

## 2023-10-17 DIAGNOSIS — M25519 Pain in unspecified shoulder: Secondary | ICD-10-CM | POA: Diagnosis not present

## 2023-10-17 DIAGNOSIS — H33321 Round hole, right eye: Secondary | ICD-10-CM | POA: Diagnosis not present

## 2023-10-31 DIAGNOSIS — H35413 Lattice degeneration of retina, bilateral: Secondary | ICD-10-CM | POA: Diagnosis not present

## 2023-10-31 DIAGNOSIS — H33321 Round hole, right eye: Secondary | ICD-10-CM | POA: Diagnosis not present

## 2023-10-31 DIAGNOSIS — H524 Presbyopia: Secondary | ICD-10-CM | POA: Diagnosis not present

## 2023-10-31 DIAGNOSIS — H33311 Horseshoe tear of retina without detachment, right eye: Secondary | ICD-10-CM | POA: Diagnosis not present

## 2023-10-31 DIAGNOSIS — H04123 Dry eye syndrome of bilateral lacrimal glands: Secondary | ICD-10-CM | POA: Diagnosis not present

## 2023-12-22 ENCOUNTER — Other Ambulatory Visit: Payer: Self-pay | Admitting: Podiatry
